# Patient Record
Sex: Male | Born: 1975 | Race: Black or African American | Hispanic: No | Smoking: Never smoker
Health system: Southern US, Community
[De-identification: ages and names within clinical notes are randomized; demographics above are authoritative.]

## PROBLEM LIST (undated history)

## (undated) DIAGNOSIS — T7840XA Allergy, unspecified, initial encounter: Secondary | ICD-10-CM

## (undated) DIAGNOSIS — K029 Dental caries, unspecified: Secondary | ICD-10-CM

## (undated) DIAGNOSIS — M549 Dorsalgia, unspecified: Secondary | ICD-10-CM

## (undated) DIAGNOSIS — G8929 Other chronic pain: Secondary | ICD-10-CM

## (undated) HISTORY — DX: Dental caries, unspecified: K02.9

## (undated) HISTORY — DX: Dorsalgia, unspecified: M54.9

## (undated) HISTORY — DX: Allergy, unspecified, initial encounter: T78.40XA

## (undated) HISTORY — DX: Other chronic pain: G89.29

---

## 2011-03-28 ENCOUNTER — Emergency Department (HOSPITAL_COMMUNITY)
Admission: EM | Admit: 2011-03-28 | Discharge: 2011-03-29 | Disposition: A | Discharge status: Home / Self Care | Payer: Self-pay | Attending: Emergency Medicine | Admitting: Emergency Medicine

## 2011-03-28 ENCOUNTER — Emergency Department (HOSPITAL_COMMUNITY): Payer: Self-pay

## 2011-03-28 DIAGNOSIS — M25539 Pain in unspecified wrist: Secondary | ICD-10-CM | POA: Insufficient documentation

## 2011-03-28 DIAGNOSIS — W1809XA Striking against other object with subsequent fall, initial encounter: Secondary | ICD-10-CM | POA: Insufficient documentation

## 2011-03-28 DIAGNOSIS — S52599A Other fractures of lower end of unspecified radius, initial encounter for closed fracture: Secondary | ICD-10-CM | POA: Insufficient documentation

## 2012-11-25 ENCOUNTER — Emergency Department (HOSPITAL_COMMUNITY)
Admission: EM | Admit: 2012-11-25 | Discharge: 2012-11-25 | Disposition: A | Discharge status: Home / Self Care | Payer: No Typology Code available for payment source | Attending: Emergency Medicine | Admitting: Emergency Medicine

## 2012-11-25 ENCOUNTER — Encounter (HOSPITAL_COMMUNITY): Payer: Self-pay | Admitting: Adult Health

## 2012-11-25 DIAGNOSIS — R259 Unspecified abnormal involuntary movements: Secondary | ICD-10-CM | POA: Insufficient documentation

## 2012-11-25 DIAGNOSIS — M5380 Other specified dorsopathies, site unspecified: Secondary | ICD-10-CM | POA: Insufficient documentation

## 2012-11-25 DIAGNOSIS — S139XXA Sprain of joints and ligaments of unspecified parts of neck, initial encounter: Secondary | ICD-10-CM | POA: Insufficient documentation

## 2012-11-25 DIAGNOSIS — M545 Low back pain: Secondary | ICD-10-CM

## 2012-11-25 DIAGNOSIS — S0993XA Unspecified injury of face, initial encounter: Secondary | ICD-10-CM | POA: Insufficient documentation

## 2012-11-25 DIAGNOSIS — R269 Unspecified abnormalities of gait and mobility: Secondary | ICD-10-CM | POA: Insufficient documentation

## 2012-11-25 DIAGNOSIS — Y92410 Unspecified street and highway as the place of occurrence of the external cause: Secondary | ICD-10-CM | POA: Insufficient documentation

## 2012-11-25 DIAGNOSIS — M62830 Muscle spasm of back: Secondary | ICD-10-CM

## 2012-11-25 DIAGNOSIS — S335XXA Sprain of ligaments of lumbar spine, initial encounter: Secondary | ICD-10-CM | POA: Insufficient documentation

## 2012-11-25 DIAGNOSIS — IMO0002 Reserved for concepts with insufficient information to code with codable children: Secondary | ICD-10-CM | POA: Insufficient documentation

## 2012-11-25 DIAGNOSIS — Y9389 Activity, other specified: Secondary | ICD-10-CM | POA: Insufficient documentation

## 2012-11-25 DIAGNOSIS — S161XXA Strain of muscle, fascia and tendon at neck level, initial encounter: Secondary | ICD-10-CM

## 2012-11-25 DIAGNOSIS — S39012A Strain of muscle, fascia and tendon of lower back, initial encounter: Secondary | ICD-10-CM

## 2012-11-25 MED ORDER — NAPROXEN 500 MG PO TABS: 500.0000 mg | ORAL_TABLET | Freq: Two times a day (BID) | ORAL | Status: DC | PRN

## 2012-11-25 MED ORDER — DIAZEPAM 5 MG PO TABS
10.0000 mg | ORAL_TABLET | Freq: Once | ORAL | Status: AC
  Administered 2012-11-25: 10 mg via ORAL
  Filled 2012-11-25: qty 2

## 2012-11-25 MED ORDER — HYDROCODONE-ACETAMINOPHEN 5-325 MG PO TABS: 1.0000 | ORAL_TABLET | Freq: Four times a day (QID) | ORAL | Status: DC | PRN

## 2012-11-25 MED ORDER — HYDROCODONE-ACETAMINOPHEN 5-325 MG PO TABS
2.0000 | ORAL_TABLET | Freq: Once | ORAL | Status: AC
  Administered 2012-11-25: 2 via ORAL
  Filled 2012-11-25: qty 2

## 2012-11-25 MED ORDER — METHOCARBAMOL 750 MG PO TABS: 750.0000 mg | ORAL_TABLET | Freq: Four times a day (QID) | ORAL | Status: DC | PRN
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
## 2012-11-26 NOTE — ED Provider Notes (Signed)
Medical screening examination/treatment/procedure(s) were performed by non-physician practitioner and as supervising physician I was immediately available for consultation/collaboration.   Kylar Leonhardt J. Ayriana Wix, MD 11/26/12 1100 

## 2018-11-06 ENCOUNTER — Ambulatory Visit (INDEPENDENT_AMBULATORY_CARE_PROVIDER_SITE_OTHER): Payer: Self-pay | Admitting: Family Medicine

## 2018-11-06 ENCOUNTER — Encounter: Payer: Self-pay | Admitting: Family Medicine

## 2018-11-06 VITALS — BP 100/68 | HR 80 | Temp 98.0°F | Ht 67.0 in | Wt 217.0 lb

## 2018-11-06 DIAGNOSIS — Z131 Encounter for screening for diabetes mellitus: Secondary | ICD-10-CM

## 2018-11-06 DIAGNOSIS — T7840XA Allergy, unspecified, initial encounter: Secondary | ICD-10-CM | POA: Insufficient documentation

## 2018-11-06 DIAGNOSIS — G8929 Other chronic pain: Secondary | ICD-10-CM | POA: Insufficient documentation

## 2018-11-06 DIAGNOSIS — M545 Low back pain, unspecified: Secondary | ICD-10-CM | POA: Insufficient documentation

## 2018-11-06 DIAGNOSIS — Z0000 Encounter for general adult medical examination without abnormal findings: Secondary | ICD-10-CM

## 2018-11-06 DIAGNOSIS — Z23 Encounter for immunization: Secondary | ICD-10-CM

## 2018-11-06 DIAGNOSIS — K029 Dental caries, unspecified: Secondary | ICD-10-CM | POA: Insufficient documentation

## 2018-11-06 DIAGNOSIS — Z09 Encounter for follow-up examination after completed treatment for conditions other than malignant neoplasm: Secondary | ICD-10-CM

## 2018-11-06 DIAGNOSIS — Z7689 Persons encountering health services in other specified circumstances: Secondary | ICD-10-CM

## 2018-11-06 LAB — POCT URINALYSIS DIP (MANUAL ENTRY)
Bilirubin, UA: NEGATIVE
Glucose, UA: NEGATIVE mg/dL
Ketones, POC UA: NEGATIVE mg/dL
Leukocytes, UA: NEGATIVE
Nitrite, UA: NEGATIVE
Protein Ur, POC: NEGATIVE mg/dL
Spec Grav, UA: 1.015 (ref 1.010–1.025)
Urobilinogen, UA: 0.2 E.U./dL
pH, UA: 6 (ref 5.0–8.0)

## 2018-11-06 LAB — POCT GLYCOSYLATED HEMOGLOBIN (HGB A1C): Hemoglobin A1C: 5 % (ref 4.0–5.6)

## 2018-11-06 MED ORDER — METHOCARBAMOL 500 MG PO TABS: 500.0000 mg | ORAL_TABLET | Freq: Two times a day (BID) | ORAL | 2 refills | Status: DC

## 2018-11-06 MED ORDER — CETIRIZINE HCL 10 MG PO TABS: 10.0000 mg | ORAL_TABLET | Freq: Every day | ORAL | 11 refills | Status: DC

## 2018-11-06 NOTE — Patient Instructions (Addendum)
Methocarbamol tablets What is this medicine? METHOCARBAMOL (meth oh KAR ba mole) helps to relieve pain and stiffness in muscles caused by strains, sprains, or other injury to your muscles. This medicine may be used for other purposes; ask your health care provider or pharmacist if you have questions. COMMON BRAND NAME(S): Robaxin What should I tell my health care provider before I take this medicine? They need to know if you have any of these conditions: -kidney disease -seizures -an unusual or allergic reaction to methocarbamol, other medicines, foods, dyes, or preservatives -pregnant or trying to get pregnant -breast-feeding How should I use this medicine? Take this medicine by mouth with a full glass of water. Follow the directions on the prescription label. Take your medicine at regular intervals. Do not take your medicine more often than directed. Talk to your pediatrician regarding the use of this medicine in children. Special care may be needed. Overdosage: If you think you have taken too much of this medicine contact a poison control center or emergency room at once. NOTE: This medicine is only for you. Do not share this medicine with others. What if I miss a dose? If you miss a dose, take it as soon as you can. If it is almost time for your next dose, take only the next dose. Do not take double or extra doses. What may interact with this medicine? Do not take this medication with any of the following medicines: -narcotic medicines for cough This medicine may also interact with the following medications: -alcohol -antihistamines for allergy, cough and cold -certain medicines for anxiety or sleep -certain medicines for depression like amitriptyline, fluoxetine, sertraline -certain medicines for seizures like phenobarbital, primidone -cholinesterase inhibitors like neostigmine, ambenonium, and pyridostigmine bromide -general anesthetics like halothane, isoflurane, methoxyflurane,  propofol -local anesthetics like lidocaine, pramoxine, tetracaine -medicines that relax muscles for surgery -narcotic medicines for pain -phenothiazines like chlorpromazine, mesoridazine, prochlorperazine, thioridazine This list may not describe all possible interactions. Give your health care provider a list of all the medicines, herbs, non-prescription drugs, or dietary supplements you use. Also tell them if you smoke, drink alcohol, or use illegal drugs. Some items may interact with your medicine. What should I watch for while using this medicine? Tell your doctor or health care professional if your symptoms do not start to get better or if they get worse. You may get drowsy or dizzy. Do not drive, use machinery, or do anything that needs mental alertness until you know how this medicine affects you. Do not stand or sit up quickly, especially if you are an older patient. This reduces the risk of dizzy or fainting spells. Alcohol may interfere with the effect of this medicine. Avoid alcoholic drinks. If you are taking another medicine that also causes drowsiness, you may have more side effects. Give your health care provider a list of all medicines you use. Your doctor will tell you how much medicine to take. Do not take more medicine than directed. Call emergency for help if you have problems breathing or unusual sleepiness. What side effects may I notice from receiving this medicine? Side effects that you should report to your doctor or health care professional as soon as possible: -allergic reactions like skin rash, itching or hives, swelling of the face, lips, or tongue -breathing problems -confusion -seizures -unusually weak or tired Side effects that usually do not require medical attention (report to your doctor or health care professional if they continue or are bothersome): -dizziness -headache -metallic taste -tiredness -upset  stomach This list may not describe all possible side  effects. Call your doctor for medical advice about side effects. You may report side effects to FDA at 1-800-FDA-1088. Where should I keep my medicine? Keep out of the reach of children. Store at room temperature between 20 and 25 degrees C (68 and 77 degrees F). Keep container tightly closed. Throw away any unused medicine after the expiration date. NOTE: This sheet is a summary. It may not cover all possible information. If you have questions about this medicine, talk to your doctor, pharmacist, or health care provider.  2019 Elsevier/Gold Standard (2015-07-18 13:11:54) Chronic Back Pain When back pain lasts longer than 3 months, it is called chronic back pain. Pain may get worse at certain times (flare-ups). There are things you can do at home to manage your pain. Follow these instructions at home: Activity      Avoid bending and other activities that make pain worse.  When standing: ? Keep your upper back and neck straight. ? Keep your shoulders pulled back. ? Avoid slouching.  When sitting: ? Keep your back straight. ? Relax your shoulders. Do not round your shoulders or pull them backward.  Do not sit or stand in one place for long periods of time.  Take short rest breaks during the day. Lying down or standing is usually better than sitting. Resting can help relieve pain.  When sitting or lying down for a long time, do some mild activity or stretching. This will help to prevent stiffness and pain.  Get regular exercise. Ask your doctor what activities are safe for you.  Do not lift anything that is heavier than 10 lb (4.5 kg). To prevent injury when you lift things: ? Bend your knees. ? Keep the weight close to your body. ? Avoid twisting. Managing pain  If told, put ice on the painful area. Your doctor may tell you to use ice for 24-48 hours after a flare-up starts. ? Put ice in a plastic bag. ? Place a towel between your skin and the bag. ? Leave the ice on for 20  minutes, 2-3 times a day.  If told, put heat on the painful area as often as told by your doctor. Use the heat source that your doctor recommends, such as a moist heat pack or a heating pad. ? Place a towel between your skin and the heat source. ? Leave the heat on for 20-30 minutes. ? Remove the heat if your skin turns bright red. This is especially important if you are unable to feel pain, heat, or cold. You may have a greater risk of getting burned.  Soak in a warm bath. This can help relieve pain.  Take over-the-counter and prescription medicines only as told by your doctor. General instructions  Sleep on a firm mattress. Try lying on your side with your knees slightly bent. If you lie on your back, put a pillow under your knees.  Keep all follow-up visits as told by your doctor. This is important. Contact a doctor if:  You have pain that does not get better with rest or medicine. Get help right away if:  One or both of your arms or legs feel weak.  One or both of your arms or legs lose feeling (numbness).  You have trouble controlling when you poop (bowel movement) or pee (urinate).  You feel sick to your stomach (nauseous).  You throw up (vomit).  You have belly (abdominal) pain.  You have shortness  of breath.  You pass out (faint). Summary  When back pain lasts longer than 3 months, it is called chronic back pain.  Pain may get worse at certain times (flare-ups).  Use ice and heat as told by your doctor. Your doctor may tell you to use ice after flare-ups. This information is not intended to replace advice given to you by your health care provider. Make sure you discuss any questions you have with your health care provider. Document Released: 03/25/2008 Document Revised: 05/22/2017 Document Reviewed: 05/22/2017 Elsevier Interactive Patient Education  2019 [employer] Inc.   Cetirizine tablets What is this medicine? CETIRIZINE (se TI ra zeen) is an antihistamine.  This medicine is used to treat or prevent symptoms of allergies. It is also used to help reduce itchy skin rash and hives. This medicine may be used for other purposes; ask your health care provider or pharmacist if you have questions. COMMON BRAND NAME(S): All Day Allergy, Allergy Relief, Zyrtec, Zyrtec Hives Relief What should I tell my health care provider before I take this medicine? They need to know if you have any of these conditions: -kidney disease -liver disease -an unusual or allergic reaction to cetirizine, hydroxyzine, other medicines, foods, dyes, or preservatives -pregnant or trying to get pregnant -breast-feeding How should I use this medicine? Take this medicine by mouth with a glass of water. Follow the directions on the prescription label. You can take this medicine with food or on an empty stomach. Take your medicine at regular times. Do not take more often than directed. You may need to take this medicine for several days before your symptoms improve. Talk to your pediatrician regarding the use of this medicine in children. Special care may be needed. While this drug may be prescribed for children as young as 436 years of age for selected conditions, precautions do apply. Overdosage: If you think you have taken too much of this medicine contact a poison control center or emergency room at once. NOTE: This medicine is only for you. Do not share this medicine with others. What if I miss a dose? If you miss a dose, take it as soon as you can. If it is almost time for your next dose, take only that dose. Do not take double or extra doses. What may interact with this medicine? -alcohol -certain medicines for anxiety or sleep -narcotic medicines for pain -other medicines for colds or allergies This list may not describe all possible interactions. Give your health care provider a list of all the medicines, herbs, non-prescription drugs, or dietary supplements you use. Also tell them  if you smoke, drink alcohol, or use illegal drugs. Some items may interact with your medicine. What should I watch for while using this medicine? Visit your doctor or health care professional for regular checks on your health. Tell your doctor if your symptoms do not improve. You may get drowsy or dizzy. Do not drive, use machinery, or do anything that needs mental alertness until you know how this medicine affects you. Do not stand or sit up quickly, especially if you are an older patient. This reduces the risk of dizzy or fainting spells. Your mouth may get dry. Chewing sugarless gum or sucking hard candy, and drinking plenty of water may help. Contact your doctor if the problem does not go away or is severe. What side effects may I notice from receiving this medicine? Side effects that you should report to your doctor or health care professional as soon as  possible: -allergic reactions like skin rash, itching or hives, swelling of the face, lips, or tongue -changes in vision or hearing -fast or irregular heartbeat -trouble passing urine or change in the amount of urine Side effects that usually do not require medical attention (report to your doctor or health care professional if they continue or are bothersome): -dizziness -dry mouth -irritability -sore throat -stomach pain -tiredness This list may not describe all possible side effects. Call your doctor for medical advice about side effects. You may report side effects to FDA at 1-800-FDA-1088. Where should I keep my medicine? Keep out of the reach of children. Store at room temperature between 15 and 30 degrees C (59 and 86 degrees F). Throw away any unused medicine after the expiration date. NOTE: This sheet is a summary. It may not cover all possible information. If you have questions about this medicine, talk to your doctor, pharmacist, or health care provider.  2019 Elsevier/Gold Standard (2014-11-01 13:44:42)     Allergies,  Adult An allergy means that your body reacts to something that bothers it (allergen). It is not a normal reaction. This can happen from something that you:  Eat.  Breathe in.  Touch. You can have an allergy (be allergic) to:  Outdoor things, like: ? Pollen. ? Grass. ? Weeds.  Indoor things, like: ? Dust. ? Smoke. ? Pet dander.  Foods.  Medicines.  Things that bother your skin, like: ? Detergents. ? Chemicals. ? Latex.  Perfume.  Bugs. An allergy cannot spread from person to person (is not contagious). Follow these instructions at home:         Stay away from things that you know you are allergic to.  If you have allergies to things in the air, wash out your nose each day. Do it with one of these: ? A salt-water (saline) spray. ? A container (neti pot).  Take over-the-counter and prescription medicines only as told by your doctor.  Keep all follow-up visits as told by your doctor. This is important.  If you are at risk for a very bad allergy reaction (anaphylaxis), keep an auto-injector with you all the time. This is called an epinephrine injection. ? This is pre-measured medicine with a needle. You can put it into your skin by yourself. ? Right after you have a very bad allergy reaction, you or a person with you must give the medicine in less than a few minutes. This is an emergency.  If you have ever had a very bad allergy reaction, wear a medical alert bracelet or necklace. Your very bad allergy should be written on it. Contact a health care provider if:  Your symptoms do not get better with treatment. Get help right away if:  You have symptoms of a very bad allergy reaction. These include: ? A swollen mouth, tongue, or throat. ? Pain or tightness in your chest. ? Trouble breathing. ? Being short of breath. ? Dizziness. ? Fainting. ? Very bad pain in your belly (abdomen). ? Throwing up (vomiting). ? Watery poop (diarrhea). Summary  An allergy  means that your body reacts to something that bothers it (allergen). It is not a normal reaction.  Stay away from things that make your body react.  Take over-the-counter and prescription medicines only as told by your doctor.  If you are at risk for a very bad allergy reaction, carry an auto-injector (epinephrine injection) all the time. Also, wear a medical alert bracelet or necklace so people know about your  allergy. This information is not intended to replace advice given to you by your health care provider. Make sure you discuss any questions you have with your health care provider. Document Released: 02/01/2013 Document Revised: 01/20/2017 Document Reviewed: 01/20/2017 Elsevier Interactive Patient Education  2019 [employer].

## 2018-11-06 NOTE — Progress Notes (Signed)
New Patient--Establish Care  Subjective:    Patient ID: Andrew Fry, male    DOB: 1976/01/01, 43 y.o.   MRN: [id number]   Chief Complaint  Patient presents with  . Establish Care  . Back Pain    HPI  Mr. Andrew Fry is a 43 year old male with no pertinent past medical history. He is here today to establish care.    Current Status: He is doing well today. He denies any history of chronic diseases and reports that his grandmother had diabetes. He states that he occasionally has allergies. He does have chronic back pain from a back injury in 2014. He does have several cavities that need to be checked, with occasional pain. He denies fevers, chills, fatigue, recent infections, weight loss, and night sweats. He has not had any headaches, visual changes, dizziness, and falls. No chest pain, heart palpitations, cough and shortness of breath reported. No reports of GI problems such as nausea, vomiting, diarrhea, and constipation. He has no reports of blood in stools, dysuria and hematuria. No depression or anxiety reported. He has moderate back pain today.    Review of Systems  Constitutional: Negative.   HENT: Negative.   Eyes: Negative.   Respiratory: Negative.   Cardiovascular: Negative.   Gastrointestinal: Negative.   Endocrine: Negative.   Genitourinary: Negative.   Musculoskeletal: Positive for back pain (chronic ).  Skin: Negative.   Allergic/Immunologic: Negative.        Seasonal   Neurological: Negative.   Hematological: Negative.   Psychiatric/Behavioral: Negative.    Objective:   Physical Exam Vitals signs and nursing note reviewed.  Constitutional:      Appearance: Normal appearance.  HENT:     Head: Normocephalic and atraumatic.     Right Ear: External ear normal.     Left Ear: External ear normal.     Nose: Nose normal.     Mouth/Throat:     Mouth: Mucous membranes are moist.     Pharynx: Oropharynx is clear.  Eyes:     Extraocular Movements: Extraocular movements  intact.     Conjunctiva/sclera: Conjunctivae normal.  Neck:     Musculoskeletal: Normal range of motion and neck supple.  Cardiovascular:     Rate and Rhythm: Normal rate and regular rhythm.     Pulses: Normal pulses.     Heart sounds: Normal heart sounds.  Abdominal:     General: Bowel sounds are normal.     Palpations: Abdomen is soft.  Musculoskeletal: Normal range of motion.  Skin:    General: Skin is warm and dry.     Capillary Refill: Capillary refill takes less than 2 seconds.  Neurological:     General: No focal deficit present.     Mental Status: He is alert and oriented to person, place, and time.  Psychiatric:        Mood and Affect: Mood normal.        Behavior: Behavior normal.        Thought Content: Thought content normal.        Judgment: Judgment normal.    Assessment & Plan:   1. Encounter to establish care  2. Chronic right-sided low back pain without sciatica We will initiate Robaxin today.  - methocarbamol (ROBAXIN) 500 MG tablet; Take 1 tablet (500 mg total) by mouth 2 (two) times daily.  Dispense: 60 tablet; Refill: 2  3. Screening for diabetes mellitus Hgb A1c is within normal level at 5.0 today.  She will continue  to decrease foods/beverages high in sugars and carbs and follow Heart Healthy or DASH diet. Increase physical activity to at least 30 minutes cardio exercise daily.  - POCT glycosylated hemoglobin (Hb A1C) - POCT urinalysis dipstick  4. Allergic state, initial encounter We will initiate Zyrtec today.  - cetirizine (ZYRTEC) 10 MG tablet; Take 1 tablet (10 mg total) by mouth daily.  Dispense: 30 tablet; Refill: 11  5. Dental caries - Ambulatory referral to Dentistry  6. Need for Tdap vaccination - Tdap vaccine greater than or equal to 7yo IM  7. Healthcare maintenance - Urine Culture - CBC with Differential - Lipid Panel - TSH - Comprehensive metabolic panel  8. Follow up He will follow up in 6 months.  Meds ordered this  encounter  Medications  . methocarbamol (ROBAXIN) 500 MG tablet    Sig: Take 1 tablet (500 mg total) by mouth 2 (two) times daily.    Dispense:  60 tablet    Refill:  2  . cetirizine (ZYRTEC) 10 MG tablet    Sig: Take 1 tablet (10 mg total) by mouth daily.    Dispense:  30 tablet    Refill:  11    Andrew IpNatalie Royale Swamy,  MSN, [facility] CenterFNP-C Patient [facility] [address]  [place], [place] [id number] [phone]

## 2018-11-07 LAB — COMPREHENSIVE METABOLIC PANEL
ALT: 18 IU/L (ref 0–44)
AST: 6 IU/L (ref 0–40)
Albumin/Globulin Ratio: 1.5 (ref 1.2–2.2)
Albumin: 4.3 g/dL (ref 3.5–5.5)
Alkaline Phosphatase: 83 IU/L (ref 39–117)
BUN/Creatinine Ratio: 13 (ref 9–20)
BUN: 12 mg/dL (ref 6–24)
Bilirubin Total: <0.2 mg/dL (ref 0.0–1.2)
CO2: 23 mmol/L (ref 20–29)
Calcium: 9.4 mg/dL (ref 8.7–10.2)
Chloride: 104 mmol/L (ref 96–106)
Creatinine, Ser: 0.94 mg/dL (ref 0.76–1.27)
GFR calc Af Amer: 115 mL/min/{1.73_m2} (ref >59)
GFR calc non Af Amer: 100 mL/min/{1.73_m2} (ref >59)
Globulin, Total: 2.8 g/dL (ref 1.5–4.5)
Glucose: 62 mg/dL — ABNORMAL LOW (ref 65–99)
Potassium: 4.2 mmol/L (ref 3.5–5.2)
Sodium: 141 mmol/L (ref 134–144)
Total Protein: 7.1 g/dL (ref 6.0–8.5)

## 2018-11-07 LAB — CBC WITH DIFFERENTIAL/PLATELET
Basophils Absolute: 0 10*3/uL (ref 0.0–0.2)
Basos: 0 %
EOS (ABSOLUTE): 0 10*3/uL (ref 0.0–0.4)
Eos: 1 %
Hematocrit: 40 % (ref 37.5–51.0)
Hemoglobin: 13.8 g/dL (ref 13.0–17.7)
Immature Grans (Abs): 0 10*3/uL (ref 0.0–0.1)
Immature Granulocytes: 1 %
Lymphocytes Absolute: 0.9 10*3/uL (ref 0.7–3.1)
Lymphs: 19 %
MCH: 30.4 pg (ref 26.6–33.0)
MCHC: 34.5 g/dL (ref 31.5–35.7)
MCV: 88 fL (ref 79–97)
Monocytes Absolute: 0.6 10*3/uL (ref 0.1–0.9)
Monocytes: 12 %
Neutrophils Absolute: 3.3 10*3/uL (ref 1.4–7.0)
Neutrophils: 67 %
Platelets: 263 10*3/uL (ref 150–450)
RBC: 4.54 x10E6/uL (ref 4.14–5.80)
RDW: 12.7 % (ref 11.6–15.4)
WBC: 4.9 10*3/uL (ref 3.4–10.8)

## 2018-11-07 LAB — TSH: TSH: 1.04 u[IU]/mL (ref 0.450–4.500)

## 2018-11-07 LAB — LIPID PANEL
Chol/HDL Ratio: 4.2 ratio (ref 0.0–5.0)
Cholesterol, Total: 220 mg/dL — ABNORMAL HIGH (ref 100–199)
HDL: 53 mg/dL (ref >39)
LDL Calculated: 122 mg/dL — ABNORMAL HIGH (ref 0–99)
Triglycerides: 224 mg/dL — ABNORMAL HIGH (ref 0–149)
VLDL Cholesterol Cal: 45 mg/dL — ABNORMAL HIGH (ref 5–40)

## 2018-11-08 LAB — URINE CULTURE: Organism ID, Bacteria: NO GROWTH

## 2018-11-09 ENCOUNTER — Other Ambulatory Visit: Payer: Self-pay | Admitting: Family Medicine

## 2018-11-09 DIAGNOSIS — E782 Mixed hyperlipidemia: Secondary | ICD-10-CM

## 2018-11-09 MED ORDER — ATORVASTATIN CALCIUM 10 MG PO TABS: 10.0000 mg | ORAL_TABLET | Freq: Every day | ORAL | 3 refills | Status: DC

## 2018-12-23 ENCOUNTER — Telehealth: Payer: Self-pay

## 2018-12-23 DIAGNOSIS — E782 Mixed hyperlipidemia: Secondary | ICD-10-CM

## 2018-12-23 DIAGNOSIS — T7840XA Allergy, unspecified, initial encounter: Secondary | ICD-10-CM

## 2018-12-23 DIAGNOSIS — M545 Low back pain: Secondary | ICD-10-CM

## 2018-12-23 DIAGNOSIS — G8929 Other chronic pain: Secondary | ICD-10-CM

## 2018-12-24 MED ORDER — ATORVASTATIN CALCIUM 10 MG PO TABS: 10.0000 mg | ORAL_TABLET | Freq: Every day | ORAL | 3 refills | Status: DC

## 2018-12-24 MED ORDER — CETIRIZINE HCL 10 MG PO TABS: 10.0000 mg | ORAL_TABLET | Freq: Every day | ORAL | 11 refills | Status: AC

## 2018-12-24 MED ORDER — METHOCARBAMOL 500 MG PO TABS: 500.0000 mg | ORAL_TABLET | Freq: Two times a day (BID) | ORAL | 2 refills | Status: DC

## 2018-12-24 NOTE — Telephone Encounter (Signed)
Medication sent to pharmacy  

## 2019-05-07 ENCOUNTER — Ambulatory Visit: Payer: Medicaid Other | Admitting: Family Medicine

## 2019-05-25 ENCOUNTER — Ambulatory Visit: Payer: Medicaid Other | Admitting: Family Medicine

## 2019-09-14 ENCOUNTER — Other Ambulatory Visit: Payer: Self-pay

## 2019-09-14 DIAGNOSIS — Z20822 Contact with and (suspected) exposure to covid-19: Secondary | ICD-10-CM

## 2020-09-19 ENCOUNTER — Ambulatory Visit: Payer: Medicaid Other | Admitting: Family Medicine

## 2020-10-10 ENCOUNTER — Other Ambulatory Visit: Payer: Self-pay

## 2020-10-10 ENCOUNTER — Ambulatory Visit (INDEPENDENT_AMBULATORY_CARE_PROVIDER_SITE_OTHER): Payer: Self-pay | Admitting: Family Medicine

## 2020-10-10 ENCOUNTER — Encounter: Payer: Self-pay | Admitting: Family Medicine

## 2020-10-10 VITALS — BP 158/108 | HR 82 | Temp 97.7°F | Ht 67.0 in | Wt 220.0 lb

## 2020-10-10 DIAGNOSIS — G8929 Other chronic pain: Secondary | ICD-10-CM

## 2020-10-10 DIAGNOSIS — E782 Mixed hyperlipidemia: Secondary | ICD-10-CM

## 2020-10-10 DIAGNOSIS — Z09 Encounter for follow-up examination after completed treatment for conditions other than malignant neoplasm: Secondary | ICD-10-CM

## 2020-10-10 DIAGNOSIS — R030 Elevated blood-pressure reading, without diagnosis of hypertension: Secondary | ICD-10-CM

## 2020-10-10 DIAGNOSIS — M545 Low back pain, unspecified: Secondary | ICD-10-CM

## 2020-10-10 DIAGNOSIS — K029 Dental caries, unspecified: Secondary | ICD-10-CM

## 2020-10-10 NOTE — Progress Notes (Signed)
[facility]    Established Patient Office Visit  Subjective:  Patient ID: Andrew Fry, male    DOB: 19-Dec-1975  Age: 44 y.o. MRN: [id number]  CC: No chief complaint on file.   HPI Andrew Fry is a 44 year old male who presents for Follow Up today.   Patient Active Problem List   Diagnosis Date Noted  . Chronic right-sided low back pain without sciatica 11/06/2018  . Allergies 11/06/2018  . Dental caries 11/06/2018    Current Status: Since his last office visit, he has c/o chronic back pain, which he is taking Motrin with minimal relief of symptoms. His blood pressure readings are elevated today. He denies fevers, chills, fatigue, recent infections, weight loss, and night sweats. Denies GI problems such as diarrhea, and constipation. He has no reports of blood in stools, dysuria and hematuria. No depression or anxiety reported today. He is taking all medications as prescribed.   Past Medical History:  Diagnosis Date  . Allergies   . Chronic back pain   . Dental caries     No past surgical history on file.  Family History  Problem Relation Age of Onset  . Breast cancer Mother     Social History   Socioeconomic History  . Marital status: Single    Spouse name: Not on file  . Number of children: Not on file  . Years of education: Not on file  . Highest education level: Not on file  Occupational History  . Not on file  Tobacco Use  . Smoking status: Never Smoker  . Smokeless tobacco: Never Used  Substance and Sexual Activity  . Alcohol use: Yes  . Drug use: No  . Sexual activity: Not on file  Other Topics Concern  . Not on file  Social History Narrative  . Not on file   Social Determinants of Health   Financial Resource Strain: Not on file  Food Insecurity: Not on file  Transportation Needs: Not on file  Physical Activity: Not on file  Stress: Not on file  Social Connections: Not on file  Intimate  Partner Violence: Not on file    Outpatient Medications Prior to Visit  Medication Sig Dispense Refill  . cetirizine (ZYRTEC) 10 MG tablet Take 1 tablet (10 mg total) by mouth daily. 30 tablet 11  . atorvastatin (LIPITOR) 10 MG tablet Take 1 tablet (10 mg total) by mouth daily. 30 tablet 3  . methocarbamol (ROBAXIN) 500 MG tablet Take 1 tablet (500 mg total) by mouth 2 (two) times daily. 60 tablet 2   No facility-administered medications prior to visit.    No Known Allergies  ROS Review of Systems  Constitutional: Negative.   HENT: Negative.   Eyes: Negative.   Respiratory: Negative.   Cardiovascular: Negative.   Gastrointestinal: Negative.   Endocrine: Negative.   Genitourinary: Negative.   Musculoskeletal: Positive for arthralgias (generalized joint pain) and back pain (chronic).  Skin: Negative.   Allergic/Immunologic: Negative.   Neurological: Positive for dizziness (occasional ) and headaches (occasional ).  Hematological: Negative.   Psychiatric/Behavioral: Negative.       Objective:    Physical Exam Vitals and nursing note reviewed.  Constitutional:      Appearance: Normal appearance.  HENT:     Head: Normocephalic and atraumatic.     Nose: Nose normal.     Mouth/Throat:     Mouth: Mucous membranes are moist.     Pharynx: Oropharynx is  clear.  Cardiovascular:     Rate and Rhythm: Normal rate and regular rhythm.     Pulses: Normal pulses.     Heart sounds: Normal heart sounds.  Pulmonary:     Effort: Pulmonary effort is normal.     Breath sounds: Normal breath sounds.  Abdominal:     General: Bowel sounds are normal.     Palpations: Abdomen is soft.  Musculoskeletal:        General: Normal range of motion.     Cervical back: Normal range of motion and neck supple.  Skin:    General: Skin is warm and dry.  Neurological:     General: No focal deficit present.     Mental Status: He is alert and oriented to person, place, and time.  Psychiatric:         Mood and Affect: Mood normal.        Behavior: Behavior normal.        Thought Content: Thought content normal.        Judgment: Judgment normal.     BP (!) 158/108   Pulse 82   Temp 97.7 F (36.5 C) (Temporal)   Ht 5\' 7"  (1.702 m)   Wt 220 lb (99.8 kg)   SpO2 96%   BMI 34.46 kg/m  Wt Readings from Last 3 Encounters:  10/10/20 220 lb (99.8 kg)  11/06/18 217 lb (98.4 kg)     Health Maintenance Due  Topic Date Due  . Hepatitis C Screening  Never done  . COVID-19 Vaccine (1) Never done  . HIV Screening  Never done  . INFLUENZA VACCINE  Never done    There are no preventive care reminders to display for this patient.  Lab Results  Component Value Date   TSH 1.040 11/06/2018   Lab Results  Component Value Date   WBC 4.9 11/06/2018   HGB 13.8 11/06/2018   HCT 40.0 11/06/2018   MCV 88 11/06/2018   PLT 263 11/06/2018   Lab Results  Component Value Date   NA 141 11/06/2018   K 4.2 11/06/2018   CO2 23 11/06/2018   GLUCOSE 62 (L) 11/06/2018   BUN 12 11/06/2018   CREATININE 0.94 11/06/2018   BILITOT <0.2 11/06/2018   ALKPHOS 83 11/06/2018   AST 6 11/06/2018   ALT 18 11/06/2018   PROT 7.1 11/06/2018   ALBUMIN 4.3 11/06/2018   CALCIUM 9.4 11/06/2018   Lab Results  Component Value Date   CHOL 220 (H) 11/06/2018   Lab Results  Component Value Date   HDL 53 11/06/2018   Lab Results  Component Value Date   LDLCALC 122 (H) 11/06/2018   Lab Results  Component Value Date   TRIG 224 (H) 11/06/2018   Lab Results  Component Value Date   CHOLHDL 4.2 11/06/2018   Lab Results  Component Value Date   HGBA1C 5.0 11/06/2018   Assessment & Plan:   1. Elevated blood pressure reading without diagnosis of hypertension Blood pressures are elevated today. Clonidine 0.3 mg given to patient in office and blood pressures remain elevated. We referred him to ED via ambulance at this time. Patient refused and signed AMA form at discharge. He denies severe headaches,  confusion, seizures, double vision, and blurred vision, nausea and vomiting. e will report to ED if he experiences these symptoms. Patient verbalized understanding.    2. Chronic right-sided low back pain without sciatica  3. Dental caries  4. Follow up He will follow up  in 1 week for Nurse Visit Only for blood pressure check.  Follow up in 3 months for Office Visit.   Meds ordered this encounter  Medications  . atorvastatin (LIPITOR) 10 MG tablet    Sig: Take 1 tablet (10 mg total) by mouth daily.    Dispense:  90 tablet    Refill:  3  . methocarbamol (ROBAXIN) 500 MG tablet    Sig: Take 1 tablet (500 mg total) by mouth 2 (two) times daily.    Dispense:  180 tablet    Refill:  1    No orders of the defined types were placed in this encounter.   Referral Orders  No referral(s) requested today    Raliegh Ip, MSN, ANE, FNP-BC [facility] [address] [place], [place] [address] [phone] fax  Problem List Items Addressed This Visit      Digestive   Dental caries     Other   Chronic right-sided low back pain without sciatica   Relevant Medications   methocarbamol (ROBAXIN) 500 MG tablet    Other Visit Diagnoses    Elevated blood pressure reading without diagnosis of hypertension    -  Primary   Follow up       Mixed hyperlipidemia       Relevant Medications   atorvastatin (LIPITOR) 10 MG tablet      Meds ordered this encounter  Medications  . atorvastatin (LIPITOR) 10 MG tablet    Sig: Take 1 tablet (10 mg total) by mouth daily.    Dispense:  90 tablet    Refill:  3  . methocarbamol (ROBAXIN) 500 MG tablet    Sig: Take 1 tablet (500 mg total) by mouth 2 (two) times daily.    Dispense:  180 tablet    Refill:  1    Follow-up: No follow-ups on file.    Kallie Locks, FNP

## 2020-10-17 ENCOUNTER — Encounter: Payer: Self-pay | Admitting: Family Medicine

## 2020-10-17 MED ORDER — METHOCARBAMOL 500 MG PO TABS: 500.0000 mg | ORAL_TABLET | Freq: Two times a day (BID) | ORAL | 1 refills | Status: DC

## 2020-10-17 MED ORDER — ATORVASTATIN CALCIUM 10 MG PO TABS: 10.0000 mg | ORAL_TABLET | Freq: Every day | ORAL | 3 refills | Status: AC

## 2020-11-10 ENCOUNTER — Ambulatory Visit: Payer: Medicaid Other | Admitting: Family Medicine

## 2021-08-24 ENCOUNTER — Ambulatory Visit (INDEPENDENT_AMBULATORY_CARE_PROVIDER_SITE_OTHER): Payer: Self-pay

## 2021-08-24 ENCOUNTER — Encounter (HOSPITAL_COMMUNITY): Payer: Self-pay

## 2021-08-24 ENCOUNTER — Other Ambulatory Visit: Payer: Self-pay

## 2021-08-24 ENCOUNTER — Ambulatory Visit (HOSPITAL_COMMUNITY)
Admission: EM | Admit: 2021-08-24 | Discharge: 2021-08-24 | Disposition: A | Discharge status: Home / Self Care | Payer: Self-pay | Attending: Physician Assistant | Admitting: Physician Assistant

## 2021-08-24 DIAGNOSIS — G8929 Other chronic pain: Secondary | ICD-10-CM

## 2021-08-24 DIAGNOSIS — M545 Low back pain, unspecified: Secondary | ICD-10-CM

## 2021-08-24 DIAGNOSIS — R030 Elevated blood-pressure reading, without diagnosis of hypertension: Secondary | ICD-10-CM

## 2021-08-24 MED ORDER — LIDOCAINE 4 % EX PTCH: MEDICATED_PATCH | CUTANEOUS | 0 refills | Status: AC

## 2021-08-24 MED ORDER — TIZANIDINE HCL 4 MG PO CAPS: 4.0000 mg | ORAL_CAPSULE | Freq: Three times a day (TID) | ORAL | 0 refills | Status: AC | PRN

## 2021-08-24 MED ORDER — AMLODIPINE BESYLATE 5 MG PO TABS: 5.0000 mg | ORAL_TABLET | Freq: Every day | ORAL | 0 refills | Status: AC

## 2021-08-24 NOTE — Discharge Instructions (Signed)
Your x-ray showed degenerative changes but no acute findings.  If your symptoms or not improving you need to follow-up with your primary care provider to consider physical therapy referral and/or reviewed imaging such as MRI.  Take Zanaflex up to 3 times a day.  This medication is not driving While taking it.  He can use lidocaine patches as needed for additional symptom relief.  Once your blood pressure is better controlled he can use ibuprofen and other NSAIDs but in the meantime you limited to Tylenol.  Use heat, rest, stretch for additional symptom relief.  Start amlodipine 5 mg daily to manage your blood pressure.  Avoid NSAIDs such as ibuprofen/Advil/Aleve.  Avoid salt, decongestants, caffeine.  Monitor your blood pressure at home.  If this remains elevated you need to be reevaluated for medication adjustment.  We will try to find your primary care provider but someone needs to evaluate you within a week.  If you have any chest pain, shortness of breath, leg swelling, vision changes, headache in setting of high blood pressure you need to go to the hospital.
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
## 2021-09-04 ENCOUNTER — Emergency Department (HOSPITAL_COMMUNITY)
Admission: EM | Admit: 2021-09-04 | Discharge: 2021-09-04 | Disposition: A | Discharge status: Home / Self Care | Payer: No Typology Code available for payment source | Attending: Emergency Medicine | Admitting: Emergency Medicine

## 2021-09-04 ENCOUNTER — Emergency Department (HOSPITAL_COMMUNITY): Payer: No Typology Code available for payment source

## 2021-09-04 ENCOUNTER — Other Ambulatory Visit: Payer: Self-pay

## 2021-09-04 DIAGNOSIS — S0990XA Unspecified injury of head, initial encounter: Secondary | ICD-10-CM | POA: Insufficient documentation

## 2021-09-04 DIAGNOSIS — S3992XA Unspecified injury of lower back, initial encounter: Secondary | ICD-10-CM | POA: Diagnosis not present

## 2021-09-04 DIAGNOSIS — S199XXA Unspecified injury of neck, initial encounter: Secondary | ICD-10-CM | POA: Insufficient documentation

## 2021-09-04 DIAGNOSIS — Z79899 Other long term (current) drug therapy: Secondary | ICD-10-CM | POA: Diagnosis not present

## 2021-09-04 DIAGNOSIS — Y9389 Activity, other specified: Secondary | ICD-10-CM | POA: Diagnosis not present

## 2021-09-04 DIAGNOSIS — M545 Low back pain, unspecified: Secondary | ICD-10-CM

## 2021-09-04 DIAGNOSIS — Y92410 Unspecified street and highway as the place of occurrence of the external cause: Secondary | ICD-10-CM | POA: Diagnosis not present

## 2021-09-04 MED ORDER — MORPHINE SULFATE 15 MG PO TABS: 7.5000 mg | ORAL_TABLET | ORAL | 0 refills | Status: AC | PRN

## 2021-09-04 NOTE — ED Notes (Signed)
ED Provider at bedside. 
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
## 2021-09-04 NOTE — Discharge Instructions (Addendum)
You will hurt worse tomorrow.  This is normal.  Please return for worsening confusion headache vomiting difficulty breathing abdominal pain.  Take 4 over the counter ibuprofen tablets 3 times a day or 2 over-the-counter naproxen tablets twice a day for pain. Also take tylenol 1000mg (2 extra strength) four times a day.   Then take the pain medicine if you feel like you need it. Narcotics do not help with the pain, they only make you care about it less.  You can become addicted to this, people may break into your house to steal it.  It will constipate you.  If you drive under the influence of this medicine you can get a DUI.

## 2021-09-06 ENCOUNTER — Encounter: Payer: Self-pay | Admitting: Family Medicine
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
